# Patient Record
Sex: Male | Born: 1975 | Race: Black or African American | Hispanic: No | Marital: Married | Smoking: Current every day smoker
Health system: Southern US, Community
[De-identification: ages and names within clinical notes are randomized; demographics above are authoritative.]

## PROBLEM LIST (undated history)

## (undated) HISTORY — PX: OTHER SURGICAL HISTORY: SHX169

---

## 1998-07-02 ENCOUNTER — Emergency Department (HOSPITAL_COMMUNITY): Admission: EM | Admit: 1998-07-02 | Discharge: 1998-07-02 | Payer: Self-pay | Admitting: Emergency Medicine

## 2001-05-11 ENCOUNTER — Emergency Department (HOSPITAL_COMMUNITY): Admission: EM | Admit: 2001-05-11 | Discharge: 2001-05-11 | Payer: Self-pay | Admitting: Emergency Medicine

## 2002-09-22 ENCOUNTER — Emergency Department (HOSPITAL_COMMUNITY): Admission: EM | Admit: 2002-09-22 | Discharge: 2002-09-22 | Payer: Self-pay | Admitting: Emergency Medicine

## 2012-08-27 ENCOUNTER — Encounter (HOSPITAL_COMMUNITY): Payer: Self-pay

## 2012-08-27 DIAGNOSIS — K089 Disorder of teeth and supporting structures, unspecified: Secondary | ICD-10-CM | POA: Insufficient documentation

## 2012-08-27 DIAGNOSIS — F17200 Nicotine dependence, unspecified, uncomplicated: Secondary | ICD-10-CM | POA: Insufficient documentation

## 2012-08-27 NOTE — ED Notes (Signed)
Pt c/o Left top dental pain starting approx 0930 this am. Pt reports the pain radiates into his neck

## 2012-08-28 ENCOUNTER — Emergency Department (HOSPITAL_COMMUNITY)
Admission: EM | Admit: 2012-08-28 | Discharge: 2012-08-28 | Discharge status: Against Medical Advice | Payer: Self-pay | Attending: Emergency Medicine | Admitting: Emergency Medicine

## 2012-08-30 ENCOUNTER — Emergency Department (HOSPITAL_COMMUNITY)
Admission: EM | Admit: 2012-08-30 | Discharge: 2012-08-30 | Discharge status: Against Medical Advice | Payer: Self-pay | Attending: Emergency Medicine | Admitting: Emergency Medicine

## 2012-08-30 ENCOUNTER — Encounter (HOSPITAL_COMMUNITY): Payer: Self-pay | Admitting: Adult Health

## 2012-08-30 DIAGNOSIS — F17200 Nicotine dependence, unspecified, uncomplicated: Secondary | ICD-10-CM | POA: Insufficient documentation

## 2012-08-30 DIAGNOSIS — K089 Disorder of teeth and supporting structures, unspecified: Secondary | ICD-10-CM | POA: Insufficient documentation

## 2012-08-30 DIAGNOSIS — K029 Dental caries, unspecified: Secondary | ICD-10-CM | POA: Insufficient documentation

## 2012-08-30 NOTE — ED Provider Notes (Signed)
Patient eloped from the emergency department before provider evaluation. I did not see or evaluate this patient. Triage note states the patient presents with left-sided, upper dental pain with broken teeth and carries.   Dahlia Client Trever Streater, PA-C 08/30/12 2241

## 2012-08-30 NOTE — ED Notes (Signed)
Presents with 2 days of left upper tooth pain, broken tooth and caries noted.

## 2012-08-31 ENCOUNTER — Emergency Department (HOSPITAL_COMMUNITY)
Admission: EM | Admit: 2012-08-31 | Discharge: 2012-08-31 | Disposition: A | Discharge status: Home / Self Care | Payer: Self-pay | Attending: Emergency Medicine | Admitting: Emergency Medicine

## 2012-08-31 ENCOUNTER — Encounter (HOSPITAL_COMMUNITY): Payer: Self-pay | Admitting: Emergency Medicine

## 2012-08-31 DIAGNOSIS — K089 Disorder of teeth and supporting structures, unspecified: Secondary | ICD-10-CM | POA: Insufficient documentation

## 2012-08-31 DIAGNOSIS — K029 Dental caries, unspecified: Secondary | ICD-10-CM | POA: Insufficient documentation

## 2012-08-31 DIAGNOSIS — K0889 Other specified disorders of teeth and supporting structures: Secondary | ICD-10-CM

## 2012-08-31 DIAGNOSIS — F17200 Nicotine dependence, unspecified, uncomplicated: Secondary | ICD-10-CM | POA: Insufficient documentation

## 2012-08-31 MED ORDER — HYDROCODONE-ACETAMINOPHEN 5-325 MG PO TABS: 1.0000 | ORAL_TABLET | ORAL | Status: AC | PRN

## 2012-08-31 MED ORDER — PENICILLIN V POTASSIUM 500 MG PO TABS: 500.0000 mg | ORAL_TABLET | Freq: Four times a day (QID) | ORAL | Status: AC

## 2012-08-31 NOTE — ED Provider Notes (Signed)
   History    CSN: [id number] Arrival date & time 08/31/12  1219  First MD Initiated Contact with Patient 08/31/12 1228     Chief Complaint  Patient presents with  . Dental Pain   (Consider location/radiation/quality/duration/timing/severity/associated sxs/prior Treatment) The history is provided by the patient.   Patient presented to the ED for dental pain. Patient states he broke his left upper molar 3 weeks ago while eating steak.  States initially did not bother him, but over the past 3 days pain has intensified. Pain worse with cold air and shooting on the affected side. Denies any numbness or paresthesias of face. No difficulty swallowing. No trismus. No fever, sweats, or chills. Patient is not currently established with a dentist.  Has taken over-the-counter pain medication without significant relief.   History reviewed. No pertinent past medical history. History reviewed. No pertinent past surgical history. No family history on file. History  Substance Use Topics  . Smoking status: Current Every Day Smoker -- 0.50 packs/day    Types: Cigarettes  . Smokeless tobacco: Not on file  . Alcohol Use: No    Review of Systems  HENT: Positive for dental problem.   All other systems reviewed and are negative.    Allergies  Review of patient's allergies indicates no known allergies.  Home Medications  No current outpatient prescriptions on file. BP 123/71  Pulse 66  Temp(Src) 98.2 F (36.8 C)  Resp 19  SpO2 98% Physical Exam  Nursing note and vitals reviewed. Constitutional: He is oriented to person, place, and time. He appears well-developed and well-nourished.  HENT:  Head: Normocephalic and atraumatic. No trismus in the jaw.  Mouth/Throat: Uvula is midline, oropharynx is clear and moist and mucous membranes are normal. No oral lesions. Abnormal dentition. Dental caries present. No dental abscesses.    Teeth largely in poor dentition, left upper molar broken with  root exposed, TTP, surrounding gingiva swollen and erythematous, no drainage; no oropharyngeal edema, airway patent, handling secretions appropriately, no trismus  Eyes: Conjunctivae and EOM are normal.  Neck: Normal range of motion. Neck supple.  Cardiovascular: Normal rate, regular rhythm and normal heart sounds.   Pulmonary/Chest: Effort normal and breath sounds normal.  Musculoskeletal: Normal range of motion.  Neurological: He is alert and oriented to person, place, and time.  Skin: Skin is warm and dry.  Psychiatric: He has a normal mood and affect.    ED Course  Procedures (including critical care time) Labs Reviewed - No data to display No results found.  1. Pain, dental     MDM   Dental pain due to broken tooth w/exposed root, surrounding gingiva swollen and erythematous-concern for dental abscess. Rx Pen-VK and Vicodin. Followup with dentist, Dr. Lucky Cowboy.  Discussed plan with patient, he agreed. Return to [place].  Garlon Hatchet, PA-C 08/31/12 1310

## 2012-08-31 NOTE — ED Notes (Signed)
Pt c/o dental pain to left upper posterior jaw, pt reports it has gotten worse the past 3 days. Pt denies seeing dentist for this issue. Pt in nad, skin warm and dry, resp e/u.

## 2012-09-03 ENCOUNTER — Telehealth (HOSPITAL_COMMUNITY): Payer: Self-pay | Admitting: Emergency Medicine

## 2012-09-03 NOTE — ED Provider Notes (Signed)
Medical screening examination/treatment/procedure(s) were performed by non-physician practitioner and as supervising physician I was immediately available for consultation/collaboration.    Alyscia Carmon J. Makinzi Prieur, MD 09/03/12 1537 

## 2012-09-03 NOTE — ED Provider Notes (Signed)
Medical screening examination/treatment/procedure(s) were performed by non-physician practitioner and as supervising physician I was immediately available for consultation/collaboration.  Haisley Arens R. Abi Shoults, MD 09/03/12 1500 

## 2014-06-16 ENCOUNTER — Encounter (HOSPITAL_COMMUNITY): Payer: Self-pay | Admitting: Emergency Medicine

## 2014-06-16 ENCOUNTER — Emergency Department (INDEPENDENT_AMBULATORY_CARE_PROVIDER_SITE_OTHER)
Admission: EM | Admit: 2014-06-16 | Discharge: 2014-06-16 | Disposition: A | Discharge status: Home / Self Care | Payer: Self-pay | Source: Home / Self Care | Attending: Emergency Medicine | Admitting: Emergency Medicine

## 2014-06-16 DIAGNOSIS — R197 Diarrhea, unspecified: Secondary | ICD-10-CM

## 2014-06-16 NOTE — ED Provider Notes (Signed)
CSN: [id number]     Arrival date & time 06/16/14  1530 History   First MD Initiated Contact with Patient 06/16/14 1655     Chief Complaint  Patient presents with  . Diarrhea  . Abdominal Pain   (Consider location/radiation/quality/duration/timing/severity/associated sxs/prior Treatment) HPI  He is a 39 year old man here for evaluation of diarrhea. He states his symptoms started this morning with his stomach feeling off. He has had multiple episodes of nonbloody, loose stools today. Last bowel movement was at 3 PM. This has been associated with some knots in his stomach. He had some mild nausea this morning, but no vomiting. No fevers. He ate at [employer] pizza last night.  This happened last month for a day as well. He denies any blood in the stool. No weight loss. He states things have started to improve this afternoon.  History reviewed. No pertinent past medical history. History reviewed. No pertinent past surgical history. No family history on file. History  Substance Use Topics  . Smoking status: Current Every Day Smoker -- 0.50 packs/day    Types: Cigarettes  . Smokeless tobacco: Not on file  . Alcohol Use: No    Review of Systems  Constitutional: Negative for fever.  HENT: Negative.   Respiratory: Negative.   Cardiovascular: Negative.   Gastrointestinal: Positive for nausea, abdominal pain and diarrhea. Negative for vomiting and blood in stool.    Allergies  Review of patient's allergies indicates no known allergies.  Home Medications   Prior to Admission medications   Medication Sig Start Date End Date Taking? Authorizing Provider  acetaminophen (TYLENOL) 500 MG tablet Take 1,000 mg by mouth 3 (three) times daily as needed for pain. For pain    Historical Provider, MD  benzocaine (ORAJEL) 10 % mucosal gel Use as directed 1 application in the mouth or throat as needed for pain. For pain    Historical Provider, MD  HYDROcodone-acetaminophen (NORCO/VICODIN) 5-325 MG per  tablet Take 1 tablet by mouth every 4 (four) hours as needed for pain. 08/31/12   Garlon HatchetLisa M Sanders, PA-C  naproxen sodium (ANAPROX) 220 MG tablet Take 220 mg by mouth daily as needed. For pain    Historical Provider, MD   BP 107/67 mmHg  Pulse 61  Temp(Src) 98.2 F (36.8 C) (Oral)  Resp 16  SpO2 98% Physical Exam  Constitutional: He is oriented to person, place, and time. He appears well-developed and well-nourished. No distress.  Neck: Neck supple.  Cardiovascular: Normal rate.   Pulmonary/Chest: Effort normal.  Abdominal: Soft. Bowel sounds are normal. He exhibits no distension and no mass. There is no tenderness. There is no rebound and no guarding.  Neurological: He is oriented to person, place, and time.    ED Course  Procedures (including critical care time) Labs Review Labs Reviewed - No data to display  Imaging Review No results found.   MDM   1. Diarrhea    This is likely related to what he ate last night. Mild food poisoning versus intolerance to greasy food. Discussed importance of hydration. Recommended keeping track of his symptoms and diet if this becomes a recurrent issue. Return precautions reviewed.    Charm RingsErin J Honig, MD 06/16/14 [phone]

## 2014-06-16 NOTE — ED Notes (Signed)
Pt. Stated, I started having diarrhea this morning and stopped at 1500. Some abdominal pain and nausea

## 2014-06-16 NOTE — Discharge Instructions (Signed)
The diarrhea and belly pain is likely in response to the heavy grease load last night. If your symptoms come back, pay attention to what you ate the night before to see if there are any trends. If you have diarrhea with associated with fevers, blood in the stool, weight loss, or unable to keep down liquids, that needs to be evaluated right away.

## 2018-07-04 ENCOUNTER — Emergency Department (HOSPITAL_COMMUNITY): Payer: BLUE CROSS/BLUE SHIELD

## 2018-07-04 ENCOUNTER — Other Ambulatory Visit: Payer: Self-pay

## 2018-07-04 ENCOUNTER — Encounter (HOSPITAL_COMMUNITY): Payer: Self-pay | Admitting: Student

## 2018-07-04 ENCOUNTER — Emergency Department (HOSPITAL_COMMUNITY)
Admission: EM | Admit: 2018-07-04 | Discharge: 2018-07-04 | Disposition: A | Discharge status: Home / Self Care | Payer: BLUE CROSS/BLUE SHIELD | Attending: Emergency Medicine | Admitting: Emergency Medicine

## 2018-07-04 DIAGNOSIS — Y999 Unspecified external cause status: Secondary | ICD-10-CM | POA: Diagnosis not present

## 2018-07-04 DIAGNOSIS — Y929 Unspecified place or not applicable: Secondary | ICD-10-CM | POA: Diagnosis not present

## 2018-07-04 DIAGNOSIS — Z23 Encounter for immunization: Secondary | ICD-10-CM | POA: Diagnosis not present

## 2018-07-04 DIAGNOSIS — Y9375 Activity, martial arts: Secondary | ICD-10-CM | POA: Insufficient documentation

## 2018-07-04 DIAGNOSIS — Z79899 Other long term (current) drug therapy: Secondary | ICD-10-CM | POA: Diagnosis not present

## 2018-07-04 DIAGNOSIS — W268XXA Contact with other sharp object(s), not elsewhere classified, initial encounter: Secondary | ICD-10-CM | POA: Insufficient documentation

## 2018-07-04 DIAGNOSIS — S61511A Laceration without foreign body of right wrist, initial encounter: Secondary | ICD-10-CM | POA: Diagnosis present

## 2018-07-04 DIAGNOSIS — F17210 Nicotine dependence, cigarettes, uncomplicated: Secondary | ICD-10-CM | POA: Insufficient documentation

## 2018-07-04 DIAGNOSIS — Y92838 Other recreation area as the place of occurrence of the external cause: Secondary | ICD-10-CM | POA: Diagnosis not present

## 2018-07-04 MED ORDER — LIDOCAINE-EPINEPHRINE (PF) 2 %-1:200000 IJ SOLN
10.0000 mL | Freq: Once | INTRAMUSCULAR | Status: AC
  Administered 2018-07-04: 10 mL
  Filled 2018-07-04: qty 10

## 2018-07-04 MED ORDER — TETANUS-DIPHTH-ACELL PERTUSSIS 5-2.5-18.5 LF-MCG/0.5 IM SUSP
0.5000 mL | Freq: Once | INTRAMUSCULAR | Status: AC
  Administered 2018-07-04: 0.5 mL via INTRAMUSCULAR
  Filled 2018-07-04: qty 0.5

## 2018-07-04 MED ORDER — BACITRACIN ZINC 500 UNIT/GM EX OINT
TOPICAL_OINTMENT | Freq: Once | CUTANEOUS | Status: AC
  Administered 2018-07-04: 1 via TOPICAL
  Filled 2018-07-04: qty 0.9

## 2018-07-04 NOTE — Discharge Instructions (Addendum)
You were seen in the emergency department today for a laceration. Your laceration was closed with 9 stitches. Please keep this area clean and dry for the next 24 hours, after 24 hours you may get this area wet, but avoid soaking the area. Keep the area covered as best possible especially when in the sun to help in minimizing scarring.  Your x-ray did not show a fracture/dislocation.   We have placed you in a splint in order to protect the wound- wear this consistently for the next 5 days, may require longer if wound does not appear to be improved.   Your tetanus has been updated  You will need to have the stitches removed and the wound rechecked in 7-10 days. Please return to the emergency department, go to an urgent care, or see your primary care provider to have this performed. Return to the ER soon should you start to experience pus type drainage from the wound, redness around the wound, or fevers as this could indicate the area is infected, please return to the ER for any other worsening symptoms or concerns that you may have.

## 2018-07-04 NOTE — ED Provider Notes (Signed)
[facility]-EMERGENCY DEPT Provider Note   CSN: [id number] Arrival date & time: 07/04/18  2045    History   Chief Complaint Chief Complaint  Patient presents with  . Extremity Laceration    HPI Ross Roman is a 43 y.o. male with a hx of tobacco abuse who presents to the ED for R wrist injury which occurred approximately 40 minutes PTA. Patient states that he participates in martial arts of some sort, he was training with another individual when he was accidentally injured with a sharp blade. The blade sliced across his R wrist as opposed to it being a penetrating type injury. He sustained a laceration to the wrist with associated discomfort that is a 2/10 in severity without alleviating/aggravating factors. No other areas of injury. Unknown last tetanus. Denies numbness, tingling, or weakness. Patient is R hand dominant.      HPI  History reviewed. No pertinent past medical history.  There are no active problems to display for this patient.   History reviewed. No pertinent surgical history.      Home Medications    Prior to Admission medications   Medication Sig Start Date End Date Taking? Authorizing Provider  acetaminophen (TYLENOL) 500 MG tablet Take 1,000 mg by mouth 3 (three) times daily as needed for pain. For pain    [provider]  benzocaine (ORAJEL) 10 % mucosal gel Use as directed 1 application in the mouth or throat as needed for pain. For pain    [provider]  HYDROcodone-acetaminophen (NORCO/VICODIN) 5-325 MG per tablet Take 1 tablet by mouth every 4 (four) hours as needed for pain. 08/31/12   Garlon HatchetSanders, Lisa M, PA-C  naproxen sodium (ANAPROX) 220 MG tablet Take 220 mg by mouth daily as needed. For pain    [provider]    Family History History reviewed. No pertinent family history.  Social History Social History   Tobacco Use  . Smoking status: Current Every Day Smoker    Packs/day: 0.50    Types:  Cigarettes  Substance Use Topics  . Alcohol use: No  . Drug use: Yes    Types: Marijuana     Allergies   Patient has no known allergies.   Review of Systems Review of Systems  Constitutional: Negative for chills and fever.  Respiratory: Negative for shortness of breath.   Cardiovascular: Negative for chest pain.  Gastrointestinal: Negative for abdominal pain.  Musculoskeletal: Positive for arthralgias.  Skin: Positive for wound.  Neurological: Negative for weakness and numbness.     Physical Exam Updated Vital Signs BP (!) 141/99 (BP Location: Left Arm)   Pulse 79   Temp 97.7 F (36.5 C) (Oral)   Resp 16   Ht 6' (1.829 m)   Wt 95.3 kg   SpO2 98%   BMI 28.48 kg/m   Physical Exam Vitals signs and nursing note reviewed.  Constitutional:      General: He is not in acute distress.    Appearance: Normal appearance. He is not ill-appearing or toxic-appearing.  HENT:     Head: Normocephalic and atraumatic.  Neck:     Musculoskeletal: Normal range of motion and neck supple.  Cardiovascular:     Rate and Rhythm: Normal rate.     Pulses:          Radial pulses are 2+ on the right side and 2+ on the left side.  Pulmonary:     Effort: No respiratory distress.     Breath  sounds: Normal breath sounds.  Musculoskeletal:     Comments: Upper extremities: Patient has an 8 cm laceration to the dorsum of the R wrist. Laceration is approximately 3-4 mm deep. No appreciable muscular or tendon involvement.  No active bleeding, no appreciable FB. Otherwise No obvious deformity, appreciable swelling, edema, erythema, ecchymosis, warmth, or open wounds. Patient has intact AROM throughout the UEs. Tender to palpation over the dorsal right wrist in area of laceration, otherwise nontender, no snuffbox tenderness, NVI distally. .   Skin:    General: Skin is warm and dry.     Capillary Refill: Capillary refill takes less than 2 seconds.  Neurological:     Mental Status: He is alert.      Comments: Alert. Clear speech. Sensation grossly intact to bilateral upper extremities. 5/5 symmetric grip strength, 5/5 symmetric strength with wrist and all digit flexion/extension. Able to perform OK sign, thumbs up, and cross 2nd/3rd digits bilaterally. Ambulatory.   Psychiatric:        Mood and Affect: Mood normal.        Behavior: Behavior normal.    ED Treatments / Results  Labs (all labs ordered are listed, but only abnormal results are displayed) Labs Reviewed - No data to display  EKG None  Radiology Dg Wrist Complete Right  Result Date: 07/04/2018 CLINICAL DATA:  43 year old male with laceration to right wrist. EXAM: RIGHT WRIST - COMPLETE 3+ VIEW COMPARISON:  None. FINDINGS: Bone mineralization is within normal limits. There is no evidence of fracture or dislocation. There is no evidence of arthropathy or other focal bone abnormality. Soft tissue defect along the dorsal proximal hand at the level of the CMC joints (image 3). No radiopaque foreign body identified. No tracking soft tissue gas. IMPRESSION: Dorsal soft tissue defect. No radiopaque foreign body or acute osseous abnormality identified. Electronically Signed   By: Odessa Fleming M.D.   On: 07/04/2018 21:23    Procedures .Marland KitchenLaceration Repair Date/Time: 07/04/2018 9:50 PM Performed by: Cherly Anderson, PA-C Authorized by: Cherly Anderson, PA-C   Consent:    Consent obtained:  Verbal   Consent given by:  Patient   Risks discussed:  Infection, need for additional repair, nerve damage, poor wound healing, poor cosmetic result, pain, retained foreign body, tendon damage and vascular damage   Alternatives discussed:  No treatment Anesthesia (see MAR for exact dosages):    Anesthesia method:  Local infiltration   Local anesthetic:  Lidocaine 2% WITH epi Laceration details:    Location: R wrist.   Length (cm):  8   Depth (mm):  3 Repair type:    Repair type:  Simple Pre-procedure details:    Preparation:   Patient was prepped and draped in usual sterile fashion and imaging obtained to evaluate for foreign bodies Exploration:    Hemostasis achieved with:  Direct pressure   Wound exploration: wound explored through full range of motion and entire depth of wound probed and visualized     Contaminated: no   Treatment:    Area cleansed with:  Betadine   Amount of cleaning:  Standard   Irrigation solution:  Sterile water   Irrigation volume:  1L   Irrigation method:  Pressure wash Skin repair:    Repair method:  Sutures   Suture size:  4-0   Suture material:  Nylon   Suture technique:  Simple interrupted   Number of sutures:  9 Approximation:    Approximation:  Close Post-procedure details:    Dressing:  Antibiotic ointment, non-adherent dressing and splint for protection   Patient tolerance of procedure:  Tolerated well, no immediate complications   (including critical care time)  SPLINT APPLICATION Date/Time: 22:30 Authorized by: Harvie Heck Consent: Verbal consent obtained. Risks and benefits: risks, benefits and alternatives were discussed Consent given by: patient Splint applied by: RN Location details: RUE Splint type: velcro wrist brace Post-procedure: The splinted body part was neurovascularly unchanged following the procedure. Patient tolerance: Patient tolerated the procedure well with no immediate complications.   Medications Ordered in ED Medications  lidocaine-EPINEPHrine (XYLOCAINE W/EPI) 2 %-1:200000 (PF) injection 10 mL (10 mLs Infiltration Given 07/04/18 2144)  Tdap (BOOSTRIX) injection 0.5 mL (0.5 mLs Intramuscular Given 07/04/18 2144)  bacitracin ointment (1 application Topical Given 07/04/18 2231)    Initial Impression / Assessment and Plan / ED Course  I have reviewed the triage vital signs and the nursing notes.  Pertinent labs & imaging results that were available during my care of the patient were reviewed by me and considered in my medical  decision making (see chart for details).    Patient presents to the emergency department with laceration to R wrist which occurred 40 minutes PTA. Patient nontoxic appearing, resting comfortably. BP mildly elevated, doubt HTN emergency. X-ray obtained in area of laceration, no fractures/dislocations or apparent radiopaque foreign bodies. Patient is neurovascularly intact distally. Pressure irrigation performed. Wound explored and base of wound visualized in a bloodless field without evidence of foreign body. No evidence of tendon/muscle involvement.  Laceration repair per procedure note above, tolerated well. Splint for protection given over joint. Tetanus iupdated at today's visit.  Discussed suture home care as well as need for wound recheck and suture removal in 7-10 days.  I discussed results, treatment plan, need for follow-up, and return precautions with the patient including signs of infection. Provided opportunity for questions, patient confirmed understanding and is in agreement with plan.     Final Clinical Impressions(s) / ED Diagnoses   Final diagnoses:  Laceration of right wrist, initial encounter    ED Discharge Orders    None       Desmond Lope 07/04/18 2307    Lorre Nick, MD 07/08/18 2241

## 2018-07-04 NOTE — ED Triage Notes (Signed)
States about 40 minutes cut left top of hand with sharp object oozing blood good movement in fingers noted.

## 2018-07-15 ENCOUNTER — Encounter (HOSPITAL_COMMUNITY): Payer: Self-pay

## 2018-07-15 ENCOUNTER — Emergency Department (HOSPITAL_COMMUNITY)
Admission: EM | Admit: 2018-07-15 | Discharge: 2018-07-15 | Disposition: A | Discharge status: Home / Self Care | Payer: BLUE CROSS/BLUE SHIELD | Attending: Emergency Medicine | Admitting: Emergency Medicine

## 2018-07-15 ENCOUNTER — Other Ambulatory Visit: Payer: Self-pay

## 2018-07-15 DIAGNOSIS — Z4802 Encounter for removal of sutures: Secondary | ICD-10-CM | POA: Diagnosis present

## 2018-07-15 DIAGNOSIS — Y929 Unspecified place or not applicable: Secondary | ICD-10-CM | POA: Diagnosis not present

## 2018-07-15 DIAGNOSIS — S61511D Laceration without foreign body of right wrist, subsequent encounter: Secondary | ICD-10-CM | POA: Diagnosis not present

## 2018-07-15 DIAGNOSIS — Y999 Unspecified external cause status: Secondary | ICD-10-CM | POA: Diagnosis not present

## 2018-07-15 DIAGNOSIS — Y939 Activity, unspecified: Secondary | ICD-10-CM | POA: Diagnosis not present

## 2018-07-15 DIAGNOSIS — F17210 Nicotine dependence, cigarettes, uncomplicated: Secondary | ICD-10-CM | POA: Diagnosis not present

## 2018-07-15 DIAGNOSIS — X58XXXD Exposure to other specified factors, subsequent encounter: Secondary | ICD-10-CM | POA: Diagnosis not present

## 2018-07-15 NOTE — ED Provider Notes (Signed)
[facility]-EMERGENCY DEPT Provider Note   CSN: [id number] Arrival date & time: 07/15/18  1003    History   Chief Complaint Chief Complaint  Patient presents with  . Suture / Staple Removal    HPI Kyley T Humber is a 43 y.o. male.     HPI Patient presented to the emergency room for suture removal.  Patient was last seen on April 29 after sustaining a laceration to the dorsal aspect of his right wrist.  Patient denies any fevers or chills since then.  The wound seems to be healing well.  He has not noticed any drainage.  He is here for suture removal as instructed. History reviewed. No pertinent past medical history.  There are no active problems to display for this patient.   Past Surgical History:  Procedure Laterality Date  . forehead surgery          Home Medications    Prior to Admission medications   Medication Sig Start Date End Date Taking? Authorizing Provider  acetaminophen (TYLENOL) 500 MG tablet Take 1,000 mg by mouth 3 (three) times daily as needed for pain. For pain    [provider]  benzocaine (ORAJEL) 10 % mucosal gel Use as directed 1 application in the mouth or throat as needed for pain. For pain    [provider]  HYDROcodone-acetaminophen (NORCO/VICODIN) 5-325 MG per tablet Take 1 tablet by mouth every 4 (four) hours as needed for pain. 08/31/12   Garlon HatchetSanders, Lisa M, PA-C  naproxen sodium (ANAPROX) 220 MG tablet Take 220 mg by mouth daily as needed. For pain    [provider]    Family History No family history on file.  Social History Social History   Tobacco Use  . Smoking status: Current Every Day Smoker    Packs/day: 0.50    Types: Cigarettes  . Smokeless tobacco: Never Used  Substance Use Topics  . Alcohol use: No  . Drug use: Yes    Types: Marijuana     Allergies   Patient has no known allergies.   Review of Systems Review of Systems  Constitutional: Negative for fever.      Physical Exam Updated Vital Signs BP (!) 139/97 (BP Location: Right Arm)   Pulse 61   Temp (!) 97.5 F (36.4 C) (Oral)   Resp 16   Ht 1.829 m (6')   Wt 95 kg   SpO2 96%   BMI 28.40 kg/m   Physical Exam Vitals signs and nursing note reviewed.  Constitutional:      General: He is not in acute distress.    Appearance: He is well-developed.  HENT:     Head: Normocephalic and atraumatic.     Right Ear: External ear normal.     Left Ear: External ear normal.  Eyes:     General: No scleral icterus.       Right eye: No discharge.        Left eye: No discharge.     Conjunctiva/sclera: Conjunctivae normal.  Neck:     Musculoskeletal: Neck supple.     Trachea: No tracheal deviation.  Cardiovascular:     Rate and Rhythm: Normal rate.  Pulmonary:     Effort: Pulmonary effort is normal. No respiratory distress.     Breath sounds: No stridor.  Abdominal:     General: There is no distension.  Musculoskeletal:        General: No swelling or deformity.  Skin:  General: Skin is warm and dry.     Findings: No rash.     Comments: Sutured laceration dorsal aspect right wrist, no signs of erythema, no evidence of wound dehiscence  Neurological:     Mental Status: He is alert.     Cranial Nerves: Cranial nerve deficit: no gross deficits.      ED Treatments / Results  Labs (all labs ordered are listed, but only abnormal results are displayed) Labs Reviewed - No data to display   Procedures Procedures (including critical care time) Alternating sutures removed initially.  No wound dehiscence. Sutures were removed without difficulty.Steri-Strips applied. Medications Ordered in ED Medications - No data to display   Initial Impression / Assessment and Plan / ED Course  I have reviewed the triage vital signs and the nursing notes.  Pertinent labs & imaging results that were available during my care of the patient were reviewed by me and considered in my medical decision making  (see chart for details).     Final Clinical Impressions(s) / ED Diagnoses   Final diagnoses:  Visit for suture removal    ED Discharge Orders    None       Linwood Dibbles, MD 07/15/18 1041

## 2018-07-15 NOTE — ED Triage Notes (Signed)
Pt is here to get the sutures removed from his left hand.

## 2018-07-15 NOTE — ED Notes (Signed)
ED Provider at bedside. 

## 2020-04-22 IMAGING — CR RIGHT WRIST - COMPLETE 3+ VIEW
4 series · 4 of 4 positions shown · non-contrast
Comparison: None.

CLINICAL DATA: 42-year-old male with laceration to right wrist.

EXAM:
RIGHT WRIST - COMPLETE 3+ VIEW

[x wrist pa right]
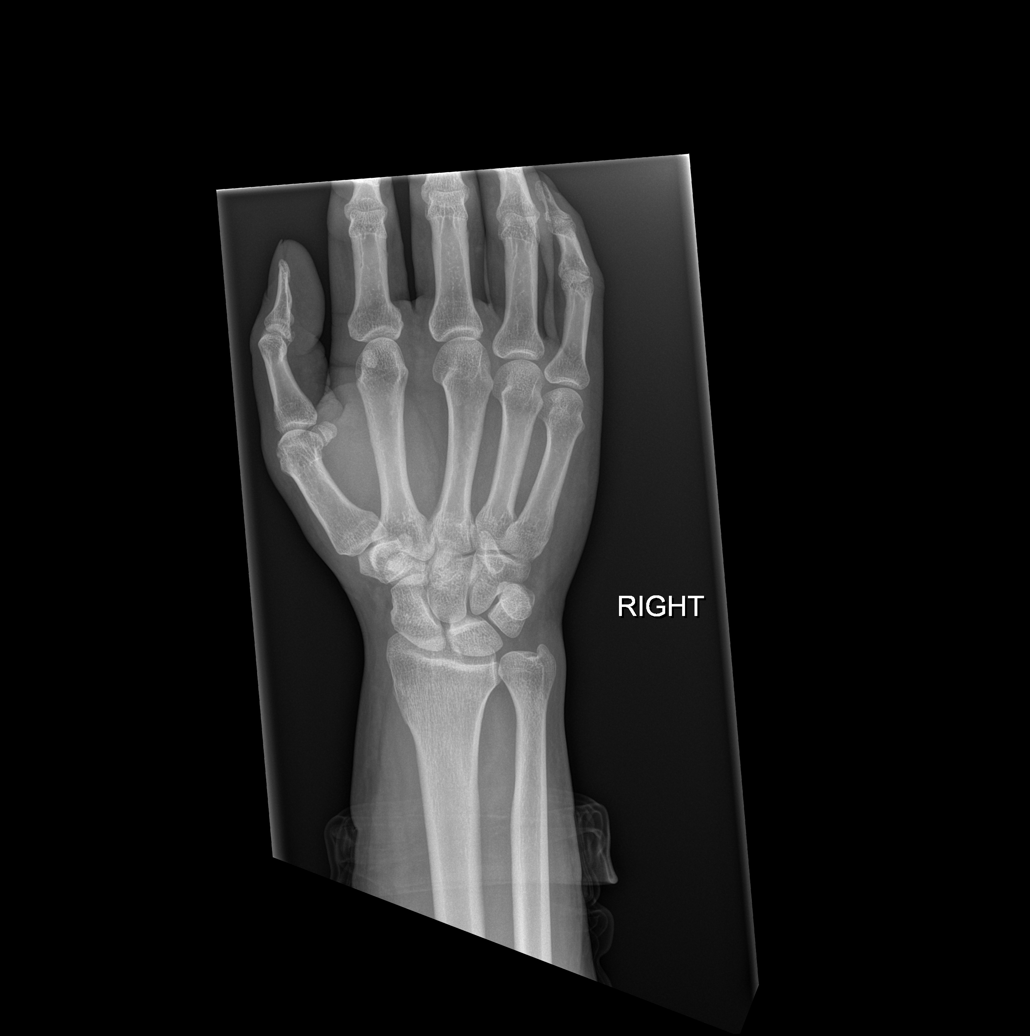

[x wrist obl right]
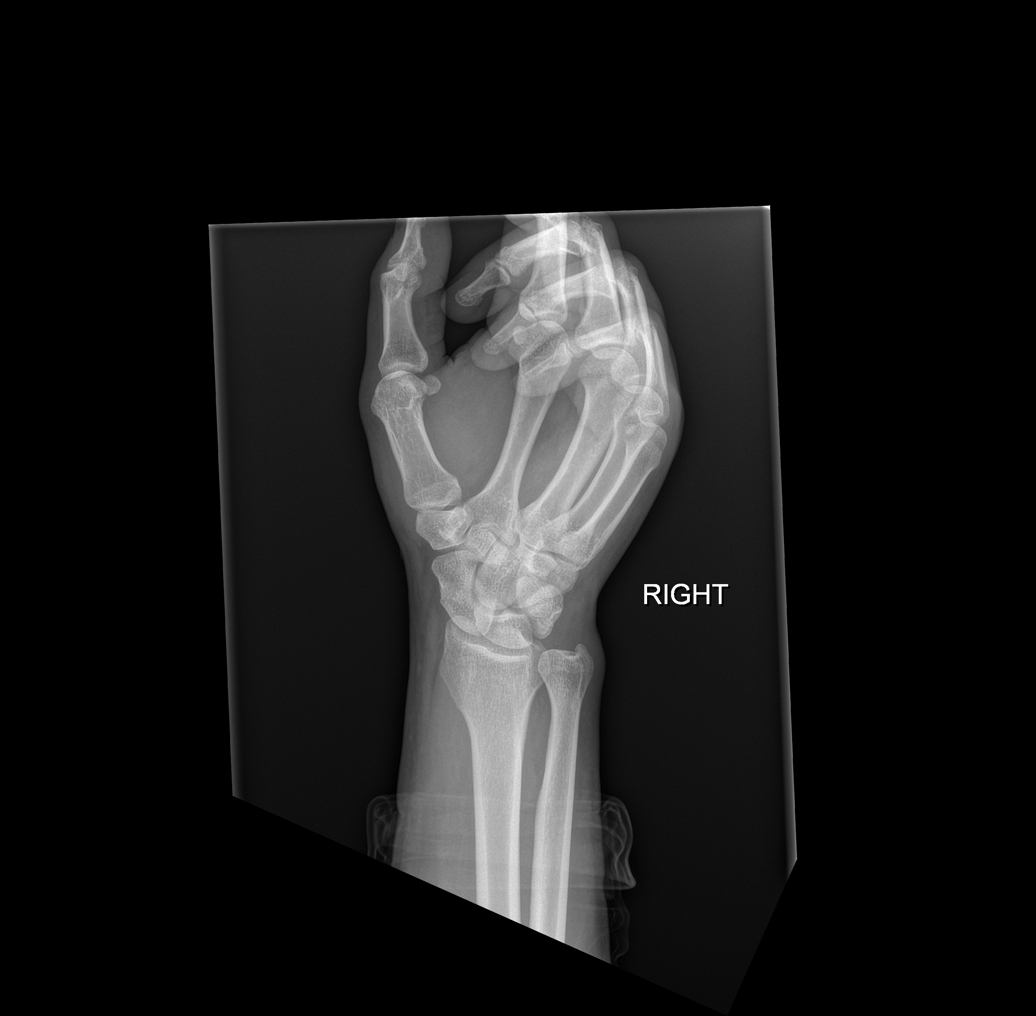

[x wrist lat right]
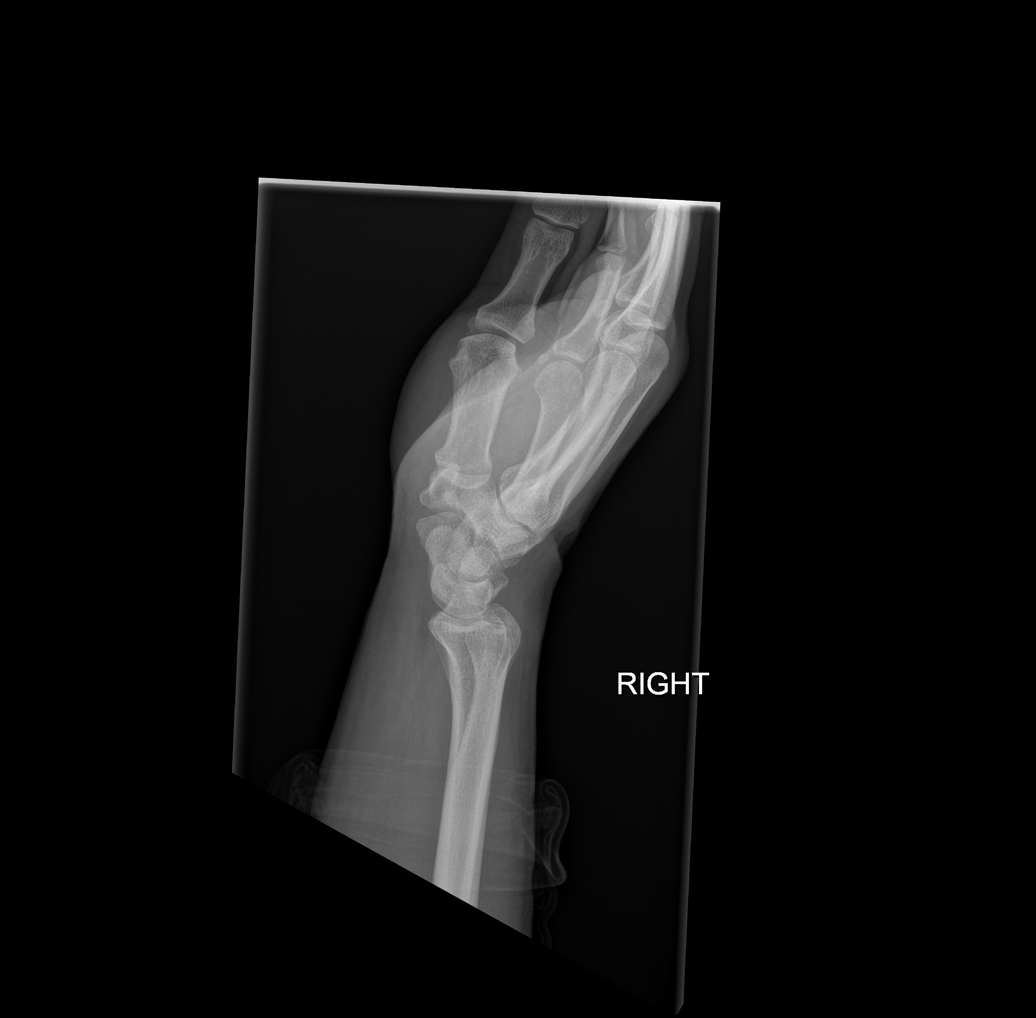

[x wrist navicular view right]
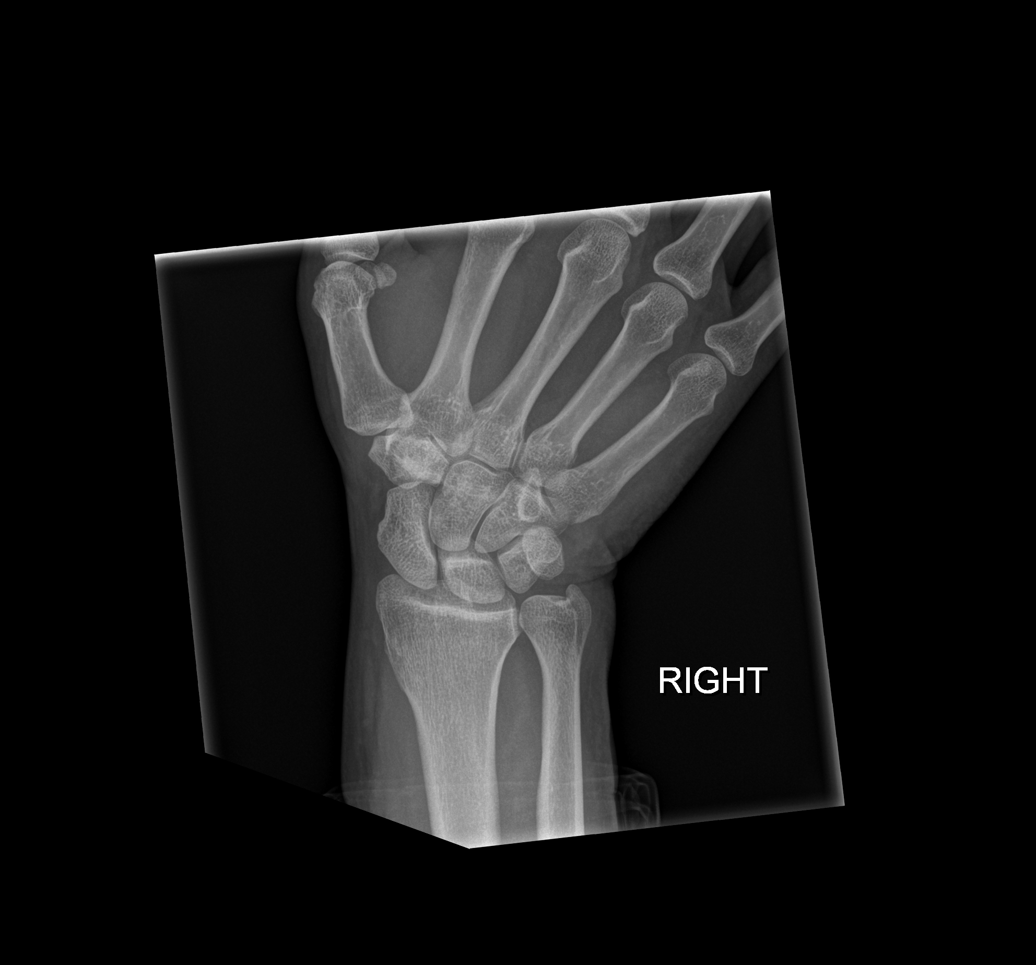

[4 of 4 positions shown; findings below may reference images not displayed]

FINDINGS: Bone mineralization is within normal limits. There is no evidence of
fracture or dislocation. There is no evidence of arthropathy or
other focal bone abnormality.

Soft tissue defect along the dorsal proximal hand at the level of
the CMC joints (image 3). No radiopaque foreign body identified. No
tracking soft tissue gas.
IMPRESSION: Dorsal soft tissue defect. No radiopaque foreign body or acute
osseous abnormality identified.
